# Patient Record
Sex: Female | Born: 2000 | Race: White | Hispanic: No | Marital: Single | Smoking: Never smoker
Health system: Southern US, Community
[De-identification: ages and names within clinical notes are randomized; demographics above are authoritative.]

## PROBLEM LIST (undated history)

## (undated) DIAGNOSIS — K219 Gastro-esophageal reflux disease without esophagitis: Secondary | ICD-10-CM

## (undated) HISTORY — DX: Gastro-esophageal reflux disease without esophagitis: K21.9

## (undated) HISTORY — PX: TYMPANOSTOMY TUBE PLACEMENT: SHX32

## (undated) HISTORY — PX: TONSILLECTOMY: SUR1361

---

## 2019-11-11 ENCOUNTER — Encounter: Payer: Self-pay | Admitting: Gastroenterology

## 2020-01-10 ENCOUNTER — Ambulatory Visit: Payer: Medicaid Other | Admitting: Gastroenterology

## 2020-01-10 ENCOUNTER — Encounter: Payer: Self-pay | Admitting: Gastroenterology

## 2020-01-10 VITALS — BP 106/72 | HR 82 | Ht 63.0 in | Wt 230.2 lb

## 2020-01-10 DIAGNOSIS — K580 Irritable bowel syndrome with diarrhea: Secondary | ICD-10-CM | POA: Diagnosis not present

## 2020-01-10 DIAGNOSIS — K219 Gastro-esophageal reflux disease without esophagitis: Secondary | ICD-10-CM | POA: Diagnosis not present

## 2020-01-10 MED ORDER — DICYCLOMINE HCL 10 MG PO CAPS: 10.0000 mg | ORAL_CAPSULE | Freq: Two times a day (BID) | ORAL | 2 refills | Status: AC

## 2020-01-10 NOTE — Progress Notes (Signed)
Chief Complaint: Abdominal pain/GERD  Referring Provider: Dr. Victory Dakin      ASSESSMENT AND PLAN;   #1.GERD with mild epi pain. Epigastric pain. D/d PUD, GERD, gastritis, nonulcer dyspepsia, gastroparesis, musculoskeletal etiology, r/o biliary or pancreatic problems.  #2. IBS-D, with previous history of constipation.  No red flag symptoms.  Plan: -EGD with SB bx -[place] abdo -Dicyclomine 10mg  po BID 1/2hr before meals and bed time. -Continue protonix 20mg  po qd. -FU thereafter. -Have reassured patient and patient's mother.  If she continues to have problems, would check stool studies and perform further work-up.   HPI:    Laurie Koch is a 19 y.o. female  Senior at [employer], companied by her mother who is a patient here.  C/O hearburn and ingestion x 1 year, was getting worse.  Seen by Dr. 12.  Started on Protonix 20 mg p.o. once a day with good but not complete relief.  She continues to have occasional regurgitation.  Some degree of epigastric discomfort.  C/O Lower abdo pain, then diarrhea x  1 year, associated with urgency.  She also has prominent postprandial symptoms.  The abdominal pain gets relieved by defecation.  No melena or hematochezia.  Has more diarrhea just before her stool cycles.  Also has abdominal bloating.  Previous history of constipation.  Again would occasionally alt with constipation (with pellet like stools). NO nocturnal symptoms.  During the course of work-up.  She had normal CBC, CMP.  No sodas, chocolates, chewing gums, artificial sweeteners and candy. No NSAIDs.  Has some degree of lactose intolerance but denies having any history suggestive of gluten intolerance.  Denies having any nausea, vomiting, jaundice dark urine or pale stools.  No fever chills or night sweats.  No weight loss.  In fact she has gained weight.  Mother has history of significant gallbladder problems and had cholecystectomy due to biliary dyskinesia.  She had similar symptoms  before.  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 GERD (gastroesophageal reflux disease)     Past Surgical History:  Procedure Laterality Date  . TONSILLECTOMY    . TYMPANOSTOMY TUBE PLACEMENT      Family History  Problem Relation Age of Onset  . Colon cancer Neg Hx   . Esophageal cancer Neg Hx     Social History   Tobacco Use  . Smoking status: Never Smoker  . Smokeless tobacco: Never Used  Vaping Use  . Vaping Use: Never used  Substance Use Topics  . Alcohol use: Never  . Drug use: Never    Current Outpatient Medications  Medication Sig Dispense Refill  . Chlorpheniramine-Phenylephrine (SUDAFED PE SINUS/ALLERGY PO) Take 1 tablet by mouth as needed.    . pantoprazole (PROTONIX) 20 MG tablet Take 20 mg by mouth daily.    Victory Dakin PIRMELLA 7/7/7 0.5/0.75/1-35 MG-MCG tablet Take 1 tablet by mouth daily.    . SODIUM FLUORIDE 5000 PPM 1.1 % PSTE Take by mouth 2 (two) times daily.     No current facility-administered medications for this visit.    Not on File  Review of Systems:  Constitutional: Denies fever, chills, diaphoresis, appetite change and fatigue.  HEENT: Denies photophobia, eye pain, redness, hearing loss, ear pain, congestion, sore throat, rhinorrhea, sneezing, mouth sores, neck pain, neck stiffness and tinnitus.   Respiratory: Denies SOB, DOE, cough, chest tightness,  and wheezing.   Cardiovascular: Denies chest pain, palpitations and leg swelling.  Genitourinary: Denies  dysuria, urgency, frequency, hematuria, flank pain and difficulty urinating.  Musculoskeletal: Denies myalgias, back pain, joint swelling, arthralgias and gait problem.  Skin: No rash.  Neurological: Denies dizziness, seizures, syncope, weakness, light-headedness, numbness and headaches.  Hematological: Denies adenopathy. Easy bruising, personal or family bleeding history  Psychiatric/Behavioral: No anxiety or depression     Physical  Exam:    BP 106/72   Pulse 82   Ht 5\' 3"  (1.6 m)   Wt 230 lb 4 oz (104.4 kg)   BMI 40.79 kg/m  Wt Readings from Last 3 Encounters:  01/10/20 230 lb 4 oz (104.4 kg) (>99 %, Z= 2.33)*   * Growth percentiles are based on CDC (Girls, 2-20 Years) data.   Constitutional:  Well-developed, in no acute distress. Psychiatric: Normal mood and affect. Behavior is normal. HEENT: Pupils normal.  Conjunctivae are normal. No scleral icterus. Neck supple.  Cardiovascular: Normal rate, regular rhythm. No edema Pulmonary/chest: Effort normal and breath sounds normal. No wheezing, rales or rhonchi. Abdominal: Soft, nondistended.  Mild left lower quadrant abdominal tenderness. Bowel sounds active throughout. There are no masses palpable. No hepatomegaly. Rectal:  defered Neurological: Alert and oriented to person place and time. Skin: Skin is warm and dry. No rashes noted.  Data Reviewed: I have personally reviewed following labs and imaging studies     03/11/20, MD 01/10/2020, 9:15 AM  Cc: Dr. 03/11/2020

## 2020-01-10 NOTE — Patient Instructions (Signed)
If you are age 20 or older, your body mass index should be between 23-30. Your Body mass index is 40.79 kg/m. If this is out of the aforementioned range listed, please consider follow up with your Primary Care Provider.  If you are age 67 or younger, your body mass index should be between 19-25. Your Body mass index is 40.79 kg/m. If this is out of the aformentioned range listed, please consider follow up with your Primary Care Provider.    You have been scheduled for an abdominal ultrasound at [facility] (1st floor [address] ) on 01/16/20 at 9am. Please arrive 15 minutes prior to your appointment for registration. Make certain not to have anything to eat or drink 6 hours prior to your appointment. Should you need to reschedule your appointment, please contact radiology at [phone]. This test typically takes about 30 minutes to perform.  You have been scheduled for an endoscopy. Please follow written instructions given to you at your visit today. If you use inhalers (even only as needed), please bring them with you on the day of your procedure.   Thank you,  Dr. Lynann Bologna

## 2020-01-16 ENCOUNTER — Ambulatory Visit (HOSPITAL_BASED_OUTPATIENT_CLINIC_OR_DEPARTMENT_OTHER)
Admission: RE | Admit: 2020-01-16 | Discharge: 2020-01-16 | Disposition: A | Discharge status: Home / Self Care | Payer: BC Managed Care – PPO | Source: Ambulatory Visit | Attending: Gastroenterology | Admitting: Gastroenterology

## 2020-01-16 ENCOUNTER — Other Ambulatory Visit: Payer: Self-pay

## 2020-01-16 DIAGNOSIS — K219 Gastro-esophageal reflux disease without esophagitis: Secondary | ICD-10-CM | POA: Insufficient documentation

## 2020-01-16 DIAGNOSIS — K580 Irritable bowel syndrome with diarrhea: Secondary | ICD-10-CM | POA: Diagnosis present

## 2020-01-16 NOTE — Addendum Note (Signed)
Addended by: Junius Roads on: 01/16/2020 11:39 AM   Modules accepted: Orders

## 2020-02-10 ENCOUNTER — Telehealth: Payer: Self-pay | Admitting: Gastroenterology

## 2020-02-10 ENCOUNTER — Encounter: Payer: BC Managed Care – PPO | Admitting: Gastroenterology

## 2020-02-10 NOTE — Telephone Encounter (Signed)
Patient mother called 8:30am 02/10/20 to inform her daughter(patient) woke up with a fever, sore throat. Patient is scheduled for EGD at 2:00PM - Dr. Chales Abrahams.    Mother states she will be having her daughter tested for COVID. Mother states she will call back to schedule after she receives the results..  Thanks

## 2020-02-10 NOTE — Telephone Encounter (Signed)
Thanks for letting me know RG 

## 2021-04-02 DIAGNOSIS — J019 Acute sinusitis, unspecified: Secondary | ICD-10-CM | POA: Diagnosis not present

## 2021-07-24 IMAGING — US US ABDOMEN COMPLETE
1 series · 14 of 25 positions shown · non-contrast
Comparison: Prior CT from 06/19/2018.

CLINICAL DATA: Initial evaluation for GERD, IBS.

EXAM:
ABDOMEN ULTRASOUND COMPLETE

[Series 1: us abdomen complete · 14 of 59 slices shown]
[im 1/59]
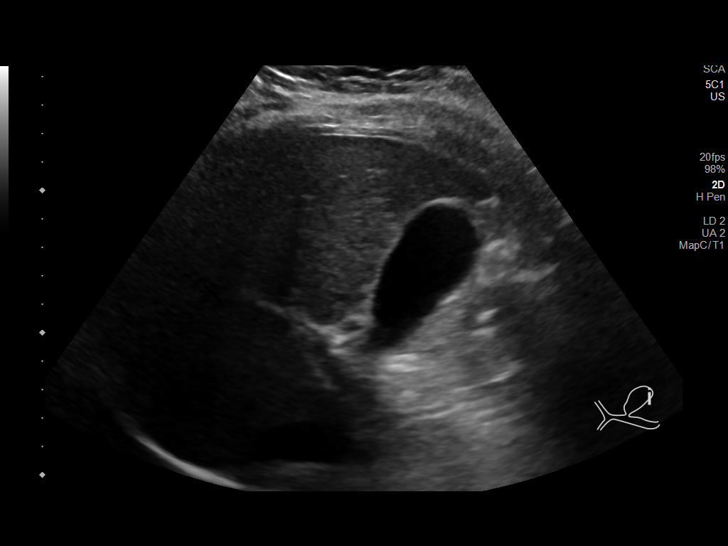
[im 5/59]
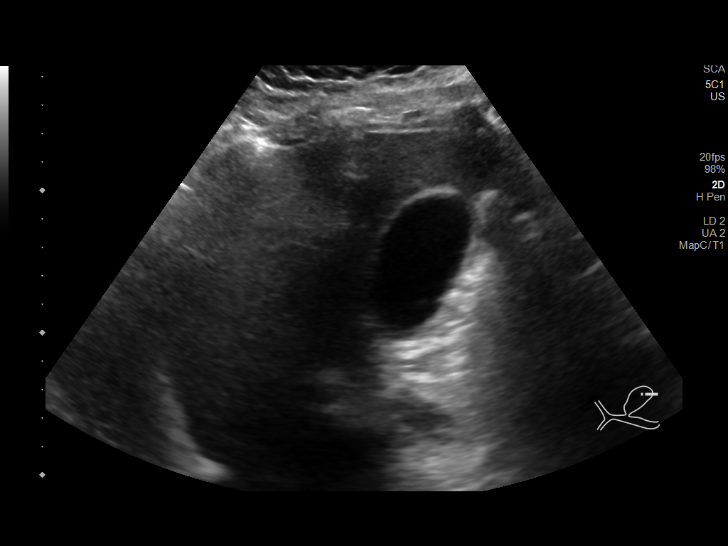
[im 10/59]
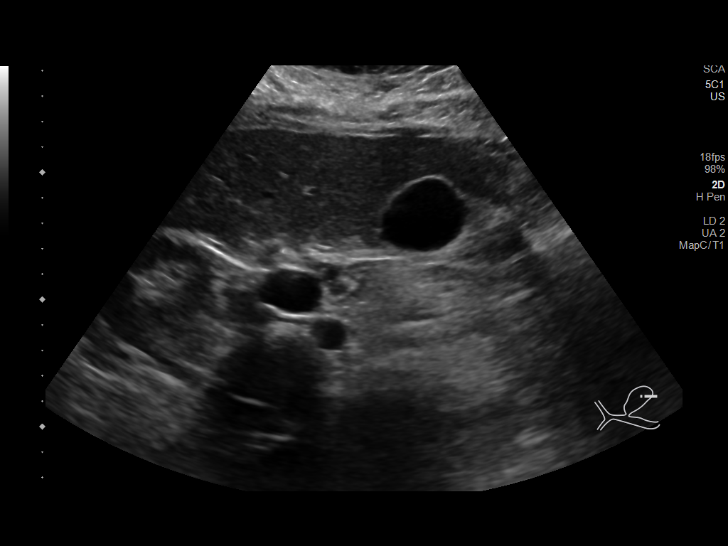
[im 15/59]
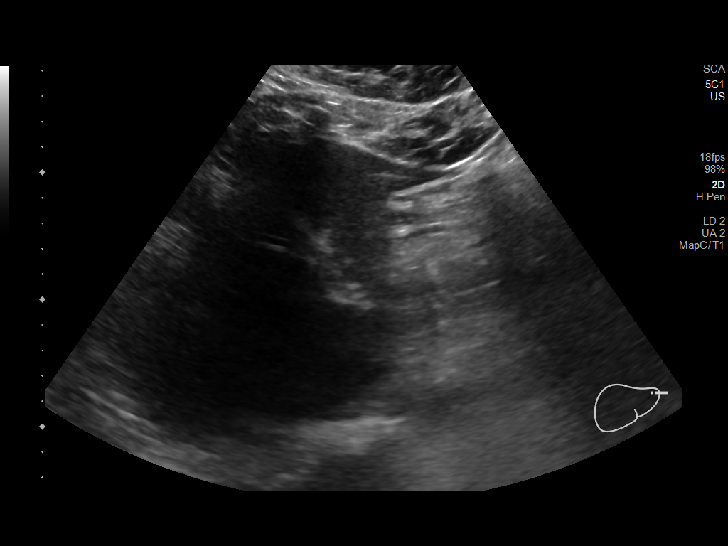
[im 20/59]
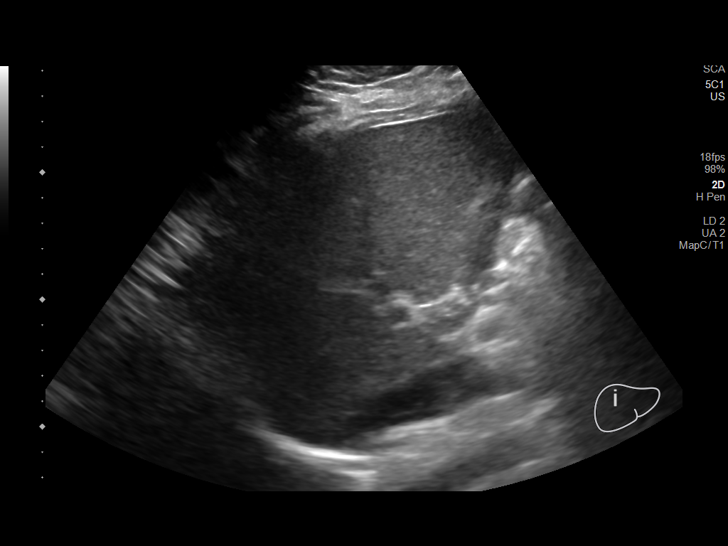
[im 22/59]
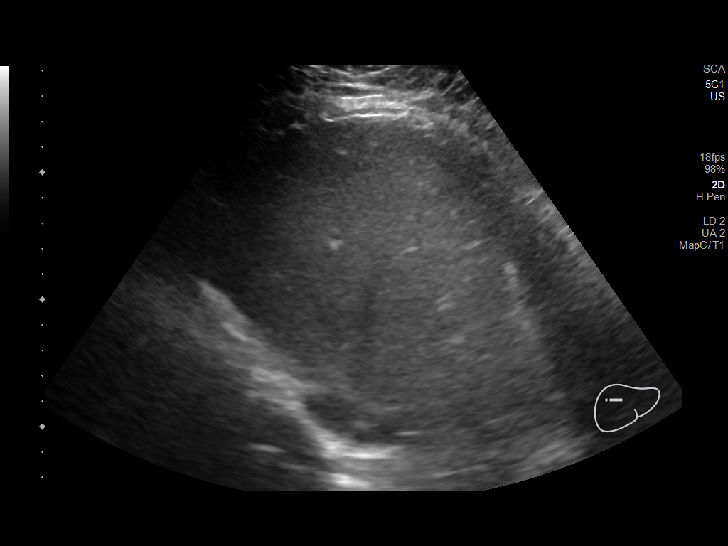
[im 27/59]
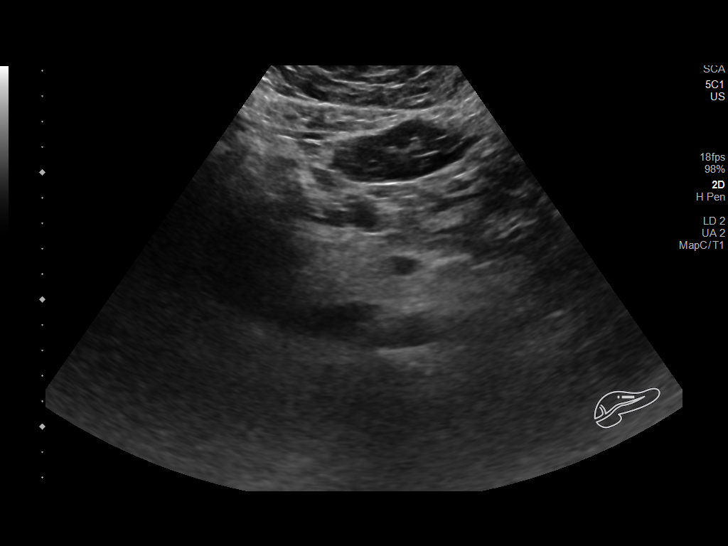
[im 32/59]
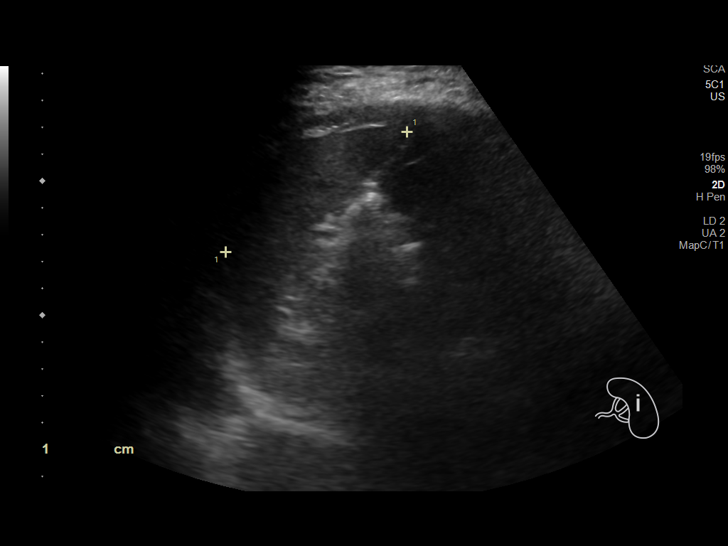
[im 37/59]
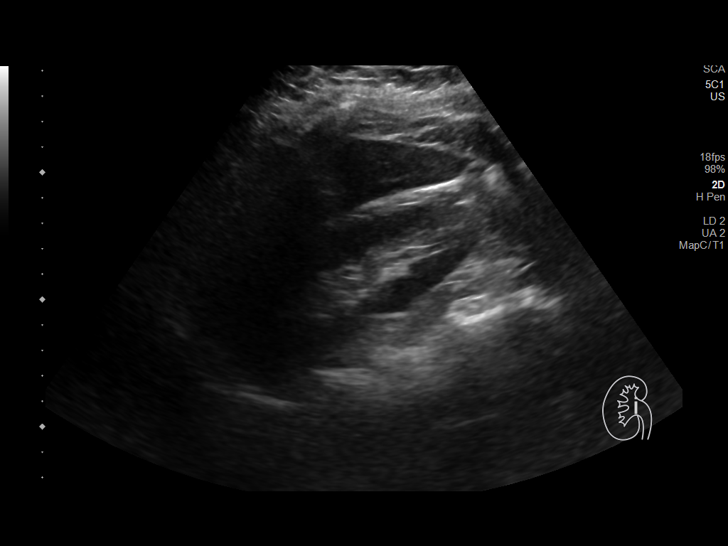
[im 39/59]
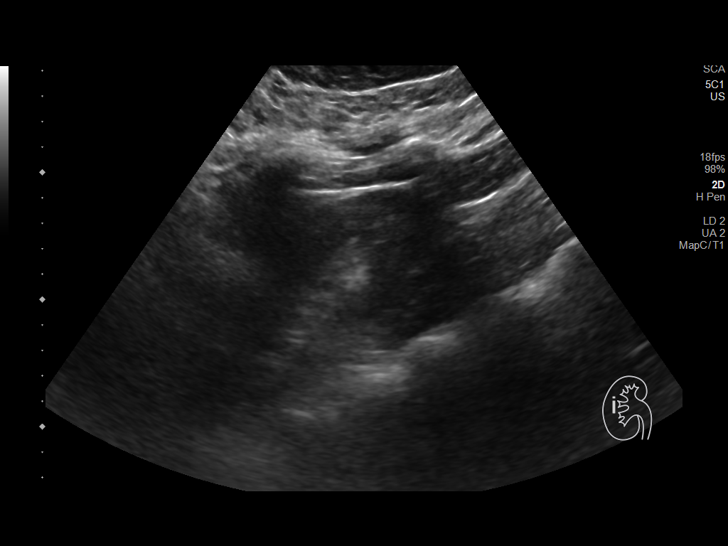
[im 44/59]
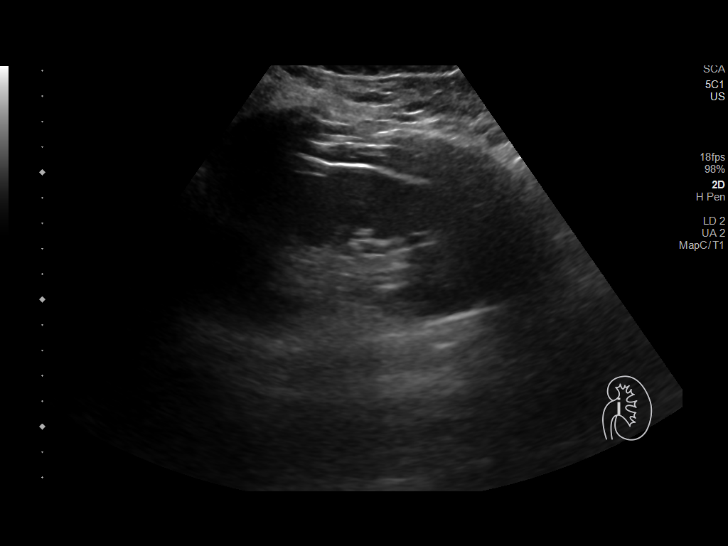
[im 49/59]
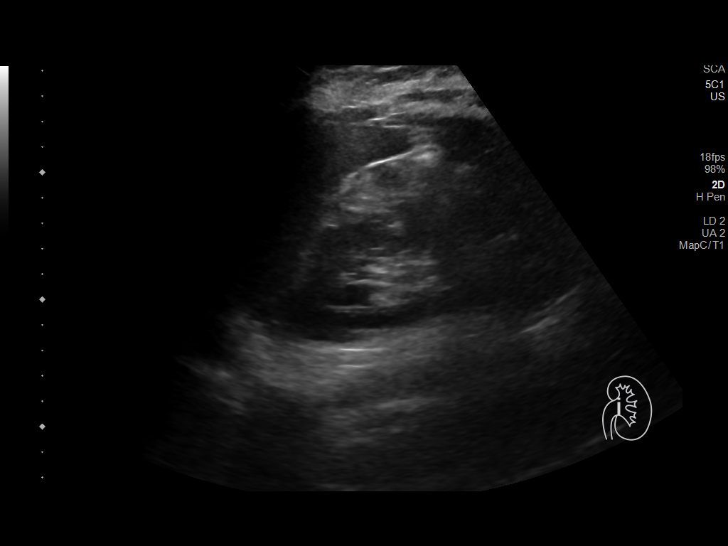
[im 54/59]
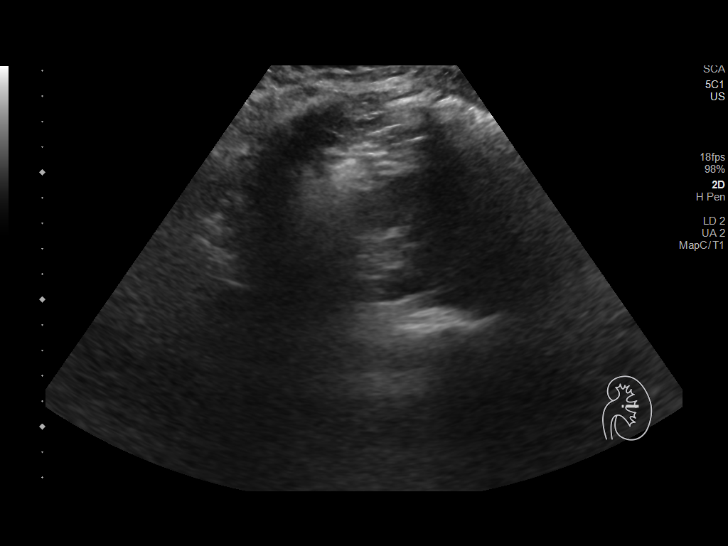
[im 59/59]
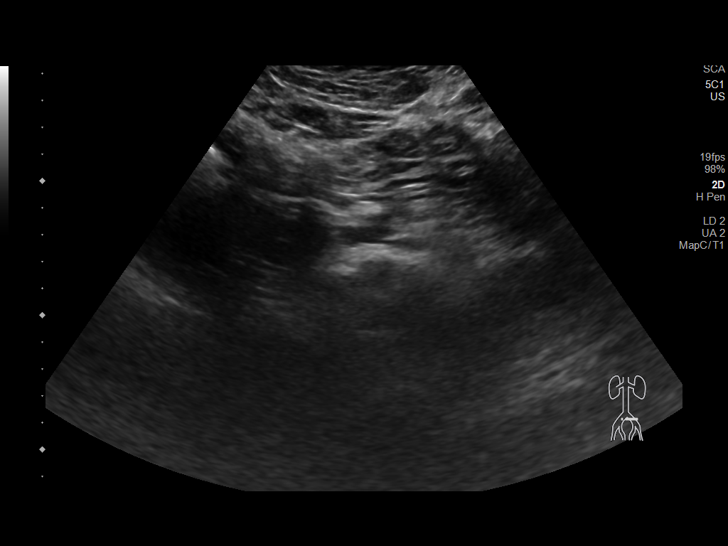

[14 of 25 positions shown; findings below may reference images not displayed]

FINDINGS: Gallbladder: No gallstones or wall thickening visualized. No
sonographic Murphy sign noted by sonographer.

Common bile duct: Diameter: 2.5 mm

Liver: No focal lesion identified. Within normal limits in
parenchymal echogenicity. Visualized portal vein appears grossly
patent.

IVC: No abnormality visualized.

Pancreas: Visualized portion unremarkable.

Spleen: Size and appearance within normal limits.

Right Kidney: Length: 11.9 cm. Echogenicity within normal limits. No
mass or hydronephrosis visualized.

Left Kidney: Length: 12.6 cm. Echogenicity within normal limits. No
mass or hydronephrosis visualized.

Abdominal aorta: No aneurysm visualized.

Other findings: None.
IMPRESSION: Normal abdominal ultrasound.

## 2021-08-03 DIAGNOSIS — S59802A Other specified injuries of left elbow, initial encounter: Secondary | ICD-10-CM | POA: Diagnosis not present

## 2021-08-06 DIAGNOSIS — M25522 Pain in left elbow: Secondary | ICD-10-CM | POA: Diagnosis not present

## 2021-08-15 DIAGNOSIS — M25522 Pain in left elbow: Secondary | ICD-10-CM | POA: Diagnosis not present

## 2021-08-20 DIAGNOSIS — M25522 Pain in left elbow: Secondary | ICD-10-CM | POA: Diagnosis not present

## 2021-11-25 DIAGNOSIS — M79674 Pain in right toe(s): Secondary | ICD-10-CM | POA: Diagnosis not present

## 2022-09-04 DIAGNOSIS — Z01419 Encounter for gynecological examination (general) (routine) without abnormal findings: Secondary | ICD-10-CM | POA: Diagnosis not present

## 2022-09-04 DIAGNOSIS — Z6841 Body Mass Index (BMI) 40.0 and over, adult: Secondary | ICD-10-CM | POA: Diagnosis not present

## 2022-10-07 DIAGNOSIS — Z23 Encounter for immunization: Secondary | ICD-10-CM | POA: Diagnosis not present

## 2022-10-13 DIAGNOSIS — J069 Acute upper respiratory infection, unspecified: Secondary | ICD-10-CM | POA: Diagnosis not present

## 2022-11-28 DIAGNOSIS — Z6841 Body Mass Index (BMI) 40.0 and over, adult: Secondary | ICD-10-CM | POA: Diagnosis not present

## 2022-11-28 DIAGNOSIS — Z3009 Encounter for other general counseling and advice on contraception: Secondary | ICD-10-CM | POA: Diagnosis not present

## 2022-11-28 DIAGNOSIS — F5081 Binge eating disorder: Secondary | ICD-10-CM | POA: Diagnosis not present

## 2023-01-14 DIAGNOSIS — F50811 Binge eating disorder, moderate: Secondary | ICD-10-CM | POA: Diagnosis not present

## 2023-01-14 DIAGNOSIS — Z6841 Body Mass Index (BMI) 40.0 and over, adult: Secondary | ICD-10-CM | POA: Diagnosis not present

## 2023-01-14 DIAGNOSIS — B3731 Acute candidiasis of vulva and vagina: Secondary | ICD-10-CM | POA: Diagnosis not present

## 2023-01-27 DIAGNOSIS — H5213 Myopia, bilateral: Secondary | ICD-10-CM | POA: Diagnosis not present

## 2023-02-18 DIAGNOSIS — F5081 Binge eating disorder, mild: Secondary | ICD-10-CM | POA: Diagnosis not present

## 2023-02-18 DIAGNOSIS — Z6841 Body Mass Index (BMI) 40.0 and over, adult: Secondary | ICD-10-CM | POA: Diagnosis not present

## 2023-03-18 DIAGNOSIS — Z6841 Body Mass Index (BMI) 40.0 and over, adult: Secondary | ICD-10-CM | POA: Diagnosis not present

## 2023-03-18 DIAGNOSIS — Z713 Dietary counseling and surveillance: Secondary | ICD-10-CM | POA: Diagnosis not present

## 2023-04-21 DIAGNOSIS — Z6841 Body Mass Index (BMI) 40.0 and over, adult: Secondary | ICD-10-CM | POA: Diagnosis not present

## 2023-04-21 DIAGNOSIS — Z713 Dietary counseling and surveillance: Secondary | ICD-10-CM | POA: Diagnosis not present

## 2023-05-19 DIAGNOSIS — E669 Obesity, unspecified: Secondary | ICD-10-CM | POA: Diagnosis not present

## 2023-05-19 DIAGNOSIS — Z6839 Body mass index (BMI) 39.0-39.9, adult: Secondary | ICD-10-CM | POA: Diagnosis not present

## 2023-05-19 DIAGNOSIS — Z713 Dietary counseling and surveillance: Secondary | ICD-10-CM | POA: Diagnosis not present

## 2023-07-14 DIAGNOSIS — Z6837 Body mass index (BMI) 37.0-37.9, adult: Secondary | ICD-10-CM | POA: Diagnosis not present

## 2023-07-14 DIAGNOSIS — E669 Obesity, unspecified: Secondary | ICD-10-CM | POA: Diagnosis not present

## 2023-07-14 DIAGNOSIS — Z713 Dietary counseling and surveillance: Secondary | ICD-10-CM | POA: Diagnosis not present

## 2023-09-14 DIAGNOSIS — E669 Obesity, unspecified: Secondary | ICD-10-CM | POA: Diagnosis not present

## 2023-09-14 DIAGNOSIS — Z6835 Body mass index (BMI) 35.0-35.9, adult: Secondary | ICD-10-CM | POA: Diagnosis not present

## 2023-09-14 DIAGNOSIS — Z713 Dietary counseling and surveillance: Secondary | ICD-10-CM | POA: Diagnosis not present

## 2024-01-22 DIAGNOSIS — Z713 Dietary counseling and surveillance: Secondary | ICD-10-CM | POA: Diagnosis not present

## 2024-03-23 DIAGNOSIS — E669 Obesity, unspecified: Secondary | ICD-10-CM | POA: Diagnosis not present

## 2024-03-23 DIAGNOSIS — Z6831 Body mass index (BMI) 31.0-31.9, adult: Secondary | ICD-10-CM | POA: Diagnosis not present

## 2024-03-23 DIAGNOSIS — Z713 Dietary counseling and surveillance: Secondary | ICD-10-CM | POA: Diagnosis not present
